# Patient Record
Sex: Male | Born: 1995 | Race: Black or African American | Hispanic: No | Marital: Single | Smoking: Never smoker
Health system: Southern US, Community
[De-identification: ages and names within clinical notes are randomized; demographics above are authoritative.]

## PROBLEM LIST (undated history)

## (undated) DIAGNOSIS — J302 Other seasonal allergic rhinitis: Secondary | ICD-10-CM

---
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
## 2018-03-08 ENCOUNTER — Encounter (HOSPITAL_COMMUNITY): Payer: Self-pay | Admitting: Emergency Medicine

## 2018-03-08 ENCOUNTER — Ambulatory Visit (HOSPITAL_COMMUNITY)
Admission: EM | Admit: 2018-03-08 | Discharge: 2018-03-08 | Disposition: A | Discharge status: Home / Self Care | Payer: Medicaid Other | Attending: Family Medicine | Admitting: Family Medicine

## 2018-03-08 DIAGNOSIS — Z880 Allergy status to penicillin: Secondary | ICD-10-CM | POA: Insufficient documentation

## 2018-03-08 DIAGNOSIS — R0981 Nasal congestion: Secondary | ICD-10-CM | POA: Insufficient documentation

## 2018-03-08 DIAGNOSIS — J029 Acute pharyngitis, unspecified: Secondary | ICD-10-CM | POA: Insufficient documentation

## 2018-03-08 LAB — POCT RAPID STREP A: Streptococcus, Group A Screen (Direct): NEGATIVE

## 2018-03-08 MED ORDER — AZITHROMYCIN 250 MG PO TABS: ORAL_TABLET | ORAL | 0 refills | Status: AC

## 2018-03-08 MED ORDER — FLUTICASONE PROPIONATE 50 MCG/ACT NA SUSP: 2.0000 | Freq: Every day | NASAL | 2 refills | Status: AC

## 2018-03-08 NOTE — Discharge Instructions (Signed)
Ibuprofen 600-800 mg every 6-8 hours for pain.  OK to take Tylenol 1000 mg (2 extra strength tabs) or 975 mg (3 regular strength tabs) every 6 hours as needed.  Salt water gargles, throat lozenges and   Continue to push fluids, practice good hand hygiene, and cover your mouth if you cough.  If you start having fevers, shaking or shortness of breath, seek immediate care.  Flonase (fluticasone); nasal spray that is over the counter. 2 sprays each nostril, once daily. Aim towards the same side eye when you spray.  There are available OTC, and the generic versions, which may be cheaper, are in parentheses. Show this to a pharmacist if you have trouble finding any of these items.

## 2018-03-08 NOTE — ED Triage Notes (Signed)
Pt c/o sore throat, congestion, fever, cough, headache x1 week.

## 2018-03-08 NOTE — ED Provider Notes (Signed)
  [facility]    CSN: [id number] Arrival date & time: 03/08/18  1758  SUBJECTIVE:   Don Ramirez is a 22 y.o. male presents to the clinic for:  Chief Complaint  Patient presents with  . Sore Throat   Here w mom. Complains of sore throat for 7 days.  Other associated symptoms: Fever (101.4 F), sinus congestion, rhinorrhea and sore throat.  Denies: sinus pain, ear pain, ear drainage, wheezing, shortness of breath, myalgia and cough Sick Contacts: contacts w/ similar symptoms Therapy to date: 400 mg of ibuprofen, Tylenol, salt water gargles  Social History   Tobacco Use  Smoking Status Not on file    ROS: Pertinent items are noted in HPI  Patient's medications, allergies, past medical, surgical, social and family histories were reviewed and updated as appropriate.  OBJECTIVE:  BP 109/68   Pulse (!) 55   Temp (!) 97.5 F (36.4 C)   Resp 18   SpO2 99%  General: Awake, alert, appearing stated age Eyes: conjunctivae and sclerae clear Ears: normal TMs bilaterally Nose: no visible exudate Oropharynx: lips, mucosa, and tongue normal; teeth and gums normal Neck: supple, +TTP cervical adenopathy b/l Lungs: clear to auscultation, no wheezes, rales or rhonchi, symmetric air entry, normal effort Heart: rate and rhythm regular Skin:reveals no rash Psych: Age appropriate judgment and insight  ASSESSMENT/PLAN:  Sore throat  Zpak given PCN allergy, tx empirically. Culture sent, Rapid strep neg. Will call when cx comes back if neg and stop abx.  Continue to practice good hand hygiene and push fluids. Ibuprofen and acetaminophen for pain. Replace toothbrush after 24 hours of being on abx. F/u w pcp prn. Pt and mother voiced understanding and agreement to the plan.    Sharlene Dory, [place] 03/08/18 2053

## 2018-03-11 LAB — CULTURE, GROUP A STREP (THRC)

## 2019-10-20 ENCOUNTER — Ambulatory Visit (HOSPITAL_COMMUNITY)
Admission: EM | Admit: 2019-10-20 | Discharge: 2019-10-20 | Disposition: A | Discharge status: Home / Self Care | Payer: Managed Care, Other (non HMO) | Attending: Physician Assistant | Admitting: Physician Assistant

## 2019-10-20 ENCOUNTER — Encounter (HOSPITAL_COMMUNITY): Payer: Self-pay

## 2019-10-20 ENCOUNTER — Other Ambulatory Visit: Payer: Self-pay

## 2019-10-20 DIAGNOSIS — K2990 Gastroduodenitis, unspecified, without bleeding: Secondary | ICD-10-CM | POA: Diagnosis not present

## 2019-10-20 DIAGNOSIS — Z880 Allergy status to penicillin: Secondary | ICD-10-CM | POA: Insufficient documentation

## 2019-10-20 DIAGNOSIS — R05 Cough: Secondary | ICD-10-CM | POA: Insufficient documentation

## 2019-10-20 DIAGNOSIS — R059 Cough, unspecified: Secondary | ICD-10-CM

## 2019-10-20 DIAGNOSIS — K2970 Gastritis, unspecified, without bleeding: Secondary | ICD-10-CM | POA: Insufficient documentation

## 2019-10-20 DIAGNOSIS — R001 Bradycardia, unspecified: Secondary | ICD-10-CM | POA: Diagnosis not present

## 2019-10-20 DIAGNOSIS — R0789 Other chest pain: Secondary | ICD-10-CM | POA: Insufficient documentation

## 2019-10-20 DIAGNOSIS — Z20828 Contact with and (suspected) exposure to other viral communicable diseases: Secondary | ICD-10-CM | POA: Diagnosis not present

## 2019-10-20 DIAGNOSIS — I498 Other specified cardiac arrhythmias: Secondary | ICD-10-CM | POA: Insufficient documentation

## 2019-10-20 MED ORDER — FAMOTIDINE 20 MG PO TABS: 20.0000 mg | ORAL_TABLET | Freq: Two times a day (BID) | ORAL | 0 refills | Status: AC

## 2019-10-20 MED ORDER — SUCRALFATE 1 GM/10ML PO SUSP: 1.0000 g | Freq: Four times a day (QID) | ORAL | 0 refills | Status: AC

## 2019-10-20 NOTE — Discharge Instructions (Addendum)
If your Covid-19 test is positive, you will receive a phone call from [facility] regarding your results. Negative test results are not called. Both positive and negative results area always visible on MyChart. If you do not have a MyChart account, sign up instructions are in your discharge papers.   Persons who are directed to care for themselves at home may discontinue isolation under the following conditions:   At least 10 days have passed since symptom onset and  At least 24 hours have passed without running a fever (this means without the use of fever-reducing medications) and  Other symptoms have improved.  Persons infected with COVID-19 who never develop symptoms may discontinue isolation and other precautions 10 days after the date of their first positive COVID-19 test.   Take the medications as prescribed: Take the carafate 4 times daily, preferably prior to meals and before bed for 5 days.  Take the famotidine twice a day for 15 days.   If your symptoms do not improve follow up with your PCP.    Go to the Emergency Department if get acutely worse to include vomiting blood, have high fever, severe chest pain, become short of breath, uncontrollable vomiting.  Refrain from over indulging in alcohol. The recommended limit per day is 2 drinks for a male.

## 2019-10-20 NOTE — ED Triage Notes (Signed)
Pt. States for 3 days now he feels like someone is sitting on his chest. A dry cough started yesterday.

## 2019-10-20 NOTE — ED Provider Notes (Addendum)
[facility]    CSN: [id number] Arrival date & time: 10/20/19  0800      History   Chief Complaint Chief Complaint  Patient presents with   Chest Pain    HPI Don Ramirez is a 23 y.o. male.   Patient reports to urgent care today for 3 day history of cough and chest pressure. Patient reports having chest pain with eating and deep breathing starting Saturday evening into Sunday morning. He reports the pain to be pressure and tightness. Eating and drinking make it much worse. He denies radiation to left arm but endorse pain moves into lower chest and upper abdomen with food or beverage. He has not taken anything for this pain.  He reports his cough started Sunday morning and has been dry. He denies fever, chills, nasal or sinus congestion. He does endorse some sore throat that is worse with eating.   Of note patient reports a heavy night of drinking on Friday 12/11. He reports many episodes of vomiting on 12/12 and other hangover like symptoms. He reports dark vomit and when asked if similar color to a black object in room he agrees. He is unsure if he had frank blood due to drinking a purple powerade. He reports that all of his symptoms began after these episodes of vomiting and that he had no preceding illnesses. He denies blood in stool or black tarry stool.       Past Medical History:  Diagnosis Date   Seasonal allergies     There are no problems to display for this patient.   History reviewed. No pertinent surgical history.     Home Medications    Prior to Admission medications   Medication Sig Start Date End Date Taking? Authorizing Provider  azithromycin (ZITHROMAX) 250 MG tablet Take 2 tabs the first day and then 1 tab daily until you run out. 03/08/18   Sharlene DoryWendling, Nicholas Paul, DO  cetirizine (ZYRTEC) 10 MG tablet Take 10 mg by mouth daily.    [provider]  famotidine (PEPCID) 20 MG tablet Take 1 tablet (20 mg total) by mouth 2 (two) times  daily. 10/20/19   Varonica Siharath, Veryl SpeakJacob E, PA-C  fluticasone (FLONASE) 50 MCG/ACT nasal spray Place 2 sprays into both nostrils daily. 03/08/18   Sharlene DoryWendling, Nicholas Paul, DO  ibuprofen (ADVIL,MOTRIN) 400 MG tablet Take 1.5 tablets (600 mg total) by mouth every 8 (eight) hours as needed for pain. 03/18/13   Moreno-Coll, Adlih, MD  loratadine (CLARITIN) 10 MG tablet Take 10 mg by mouth daily.    [provider]  OVER THE COUNTER MEDICATION Allergy medicine    [provider]  sucralfate (CARAFATE) 1 GM/10ML suspension Take 10 mLs (1 g total) by mouth 4 (four) times daily for 5 days. 10/20/19 10/25/19  Maylie Ashton, Veryl SpeakJacob E, PA-C    Family History Family History  Problem Relation Age of Onset   Healthy Mother    Healthy Father     Social History Social History   Tobacco Use   Smoking status: Never Smoker   Smokeless tobacco: Never Used  Substance Use Topics   Alcohol use: Not Currently   Drug use: Never     Allergies   Penicillins   Review of Systems Review of Systems  Constitutional: Negative for appetite change, chills, fatigue and fever.  HENT: Negative for congestion, ear pain, postnasal drip, rhinorrhea, sinus pressure, sinus pain and sore throat.   Eyes: Negative for pain and visual disturbance.  Respiratory: Positive for  cough and chest tightness. Negative for shortness of breath and wheezing.   Cardiovascular: Negative for chest pain and palpitations.  Gastrointestinal: Positive for vomiting. Negative for abdominal pain, diarrhea and nausea.  Endocrine: Negative for polyuria.  Genitourinary: Negative for dysuria and hematuria.  Musculoskeletal: Negative for arthralgias, back pain and myalgias.  Skin: Negative for color change and rash.  Neurological: Negative for dizziness, syncope, light-headedness and headaches.  Psychiatric/Behavioral: Negative.   All other systems reviewed and are negative.    Physical Exam Triage Vital Signs ED Triage Vitals  Enc  Vitals Group     BP 10/20/19 0816 118/71     Pulse --      Resp 10/20/19 0816 18     Temp 10/20/19 0816 97.8 F (36.6 C)     Temp Source 10/20/19 0816 Oral     SpO2 10/20/19 0816 100 %     Weight 10/20/19 0814 140 lb (63.5 kg)     Height --      Head Circumference --      Peak Flow --      Pain Score 10/20/19 0814 5     Pain Loc --      Pain Edu? --      Excl. in [place]? --    No data found.  Updated Vital Signs BP 118/71 (BP Location: Right Arm)    Pulse (!) 57    Temp 97.8 F (36.6 C) (Oral)    Resp 18    Wt 140 lb (63.5 kg)    SpO2 100%   Visual Acuity Right Eye Distance:   Left Eye Distance:   Bilateral Distance:    Right Eye Near:   Left Eye Near:    Bilateral Near:     Physical Exam Vitals and nursing note reviewed.  Constitutional:      General: He is not in acute distress.    Appearance: He is well-developed and normal weight. He is not ill-appearing.  HENT:     Head: Normocephalic and atraumatic.  Eyes:     Conjunctiva/sclera: Conjunctivae normal.     Pupils: Pupils are equal, round, and reactive to light.  Neck:     Trachea: No tracheal deviation.     Comments: No subQ emphysema  Cardiovascular:     Rate and Rhythm: Regular rhythm. Bradycardia present.     Pulses:          Radial pulses are 2+ on the right side and 2+ on the left side.     Heart sounds: Normal heart sounds. Heart sounds not distant. No murmur. No systolic murmur. No friction rub.  Pulmonary:     Effort: Pulmonary effort is normal. No tachypnea, accessory muscle usage or respiratory distress.     Breath sounds: Normal breath sounds. No stridor.  Chest:     Chest wall: No mass, tenderness or crepitus.  Abdominal:     General: Bowel sounds are normal.     Palpations: Abdomen is soft. There is no hepatomegaly, splenomegaly or mass.     Tenderness: There is no abdominal tenderness. There is no guarding.  Musculoskeletal:        General: Normal range of motion.     Cervical back: Normal  range of motion and neck supple.     Right lower leg: No edema.     Left lower leg: No edema.  Lymphadenopathy:     Cervical: No cervical adenopathy.  Skin:    General: Skin is warm and dry.  Capillary Refill: Capillary refill takes less than 2 seconds.     Coloration: Skin is not cyanotic.     Findings: No erythema.  Neurological:     General: No focal deficit present.     Mental Status: He is alert and oriented to person, place, and time.  Psychiatric:        Mood and Affect: Mood normal.        Behavior: Behavior normal.      UC Treatments / Results  Labs (all labs ordered are listed, but only abnormal results are displayed) Labs Reviewed  NOVEL CORONAVIRUS, NAA (HOSP ORDER, SEND-OUT TO REF LAB; TAT 18-24 HRS)    EKG Sinus Bradycardia with evidence of sinus arrhthymias. Non-specific/age related variation in V1. NO ST Elevation or Heart block present.  Radiology No results found.  Procedures Procedures (including critical care time)  Medications Ordered in UC Medications - No data to display  Initial Impression / Assessment and Plan / UC Course  I have reviewed the triage vital signs and the nursing notes.  Pertinent labs & imaging results that were available during my care of the patient were reviewed by me and considered in my medical decision making (see chart for details).     #Gastritis #Cough # bradycardia - Likely caused by repeated vomiting and retching following heavy night of drinking. Felt images not needed this time as symptoms are improving somewhat and no longer vomiting. Close follow up parameters given if return of symptoms and discharged with sulcrafate and famotidine for symptom management. ECG age appropriate and non-pathologic.Covid PCR was sent due to cough and consideration for GI symptoms and uncertain exposure risk. isolation parameters given Final Clinical Impressions(s) / UC Diagnoses   Final diagnoses:  Cough  Gastritis and  gastroduodenitis  Other chest pain  Bradycardia, sinus  Sinus arrhythmia     Discharge Instructions     If your Covid-19 test is positive, you will receive a phone call from [facility] regarding your results. Negative test results are not called. Both positive and negative results area always visible on MyChart. If you do not have a MyChart account, sign up instructions are in your discharge papers.   Persons who are directed to care for themselves at home may discontinue isolation under the following conditions:   At least 10 days have passed since symptom onset and  At least 24 hours have passed without running a fever (this means without the use of fever-reducing medications) and  Other symptoms have improved.  Persons infected with COVID-19 who never develop symptoms may discontinue isolation and other precautions 10 days after the date of their first positive COVID-19 test.   Take the medications as prescribed: Take the carafate 4 times daily, preferably prior to meals and before bed for 5 days.  Take the famotidine twice a day for 15 days.   If your symptoms do not improve follow up with your PCP.    Go to the Emergency Department if get acutely worse to include vomiting blood, have high fever, severe chest pain, become short of breath, uncontrollable vomiting.  Refrain from over indulging in alcohol. The recommended limit per day is 2 drinks for a male.         ED Prescriptions    Medication Sig Dispense Auth. Provider   sucralfate (CARAFATE) 1 GM/10ML suspension Take 10 mLs (1 g total) by mouth 4 (four) times daily for 5 days. 200 mL Denesha Brouse, Veryl Speak, PA-C   famotidine (PEPCID) 20  MG tablet Take 1 tablet (20 mg total) by mouth 2 (two) times daily. 30 tablet Zaydee Aina, Veryl Speak, PA-C     PDMP not reviewed this encounter.   Hermelinda Medicus, PA-C 10/20/19 0933    Ayanna Gheen, Veryl Speak, PA-C 10/20/19 [phone]

## 2019-10-22 LAB — NOVEL CORONAVIRUS, NAA (HOSP ORDER, SEND-OUT TO REF LAB; TAT 18-24 HRS): SARS-CoV-2, NAA: NOT DETECTED
